# Patient Record
Sex: Female | Born: 1940 | Race: White | Hispanic: No | Marital: Married | Smoking: Never smoker
Health system: Southern US, Community
[De-identification: ages and names within clinical notes are randomized; demographics above are authoritative.]

## PROBLEM LIST (undated history)

## (undated) DIAGNOSIS — F329 Major depressive disorder, single episode, unspecified: Secondary | ICD-10-CM

## (undated) DIAGNOSIS — K219 Gastro-esophageal reflux disease without esophagitis: Secondary | ICD-10-CM

## (undated) DIAGNOSIS — E785 Hyperlipidemia, unspecified: Secondary | ICD-10-CM

## (undated) DIAGNOSIS — R7302 Impaired glucose tolerance (oral): Secondary | ICD-10-CM

## (undated) DIAGNOSIS — M7072 Other bursitis of hip, left hip: Secondary | ICD-10-CM

## (undated) DIAGNOSIS — F32A Depression, unspecified: Secondary | ICD-10-CM

## (undated) DIAGNOSIS — I10 Essential (primary) hypertension: Secondary | ICD-10-CM

## (undated) HISTORY — DX: Impaired glucose tolerance (oral): R73.02

## (undated) HISTORY — PX: CHOLECYSTECTOMY: SHX55

## (undated) HISTORY — DX: Essential (primary) hypertension: I10

## (undated) HISTORY — DX: Gastro-esophageal reflux disease without esophagitis: K21.9

## (undated) HISTORY — DX: Depression, unspecified: F32.A

## (undated) HISTORY — DX: Other bursitis of hip, left hip: M70.72

## (undated) HISTORY — PX: TONSILLECTOMY: SUR1361

## (undated) HISTORY — DX: Hyperlipidemia, unspecified: E78.5

---
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
## 2019-10-22 ENCOUNTER — Telehealth: Payer: Self-pay | Admitting: *Deleted

## 2019-10-22 NOTE — Telephone Encounter (Signed)
Spoke with [name] and rescheduled his appointment for 12/02/19 with Dr. Ardyth Harps so that his wife, Sonya Armstrong could get established first on 10/27/19.  Copied from CRM [id number]. Topic: General - Other >> Oct 22, 2019  1:15 PM Angela Nevin wrote: Patient's husband would like to know if patient can come with him and be seen right after his upcoming appointment on 1/13. Patient's husband is Sonya Armstrong.

## 2019-10-27 ENCOUNTER — Encounter: Payer: Self-pay | Admitting: Internal Medicine

## 2019-10-27 ENCOUNTER — Ambulatory Visit (INDEPENDENT_AMBULATORY_CARE_PROVIDER_SITE_OTHER): Payer: Medicare HMO | Admitting: Internal Medicine

## 2019-10-27 ENCOUNTER — Other Ambulatory Visit: Payer: Self-pay

## 2019-10-27 VITALS — BP 110/70 | HR 58 | Temp 97.7°F | Ht 61.0 in | Wt 127.7 lb

## 2019-10-27 DIAGNOSIS — Z23 Encounter for immunization: Secondary | ICD-10-CM

## 2019-10-27 DIAGNOSIS — R7302 Impaired glucose tolerance (oral): Secondary | ICD-10-CM

## 2019-10-27 DIAGNOSIS — F32A Depression, unspecified: Secondary | ICD-10-CM | POA: Insufficient documentation

## 2019-10-27 DIAGNOSIS — K219 Gastro-esophageal reflux disease without esophagitis: Secondary | ICD-10-CM | POA: Diagnosis not present

## 2019-10-27 DIAGNOSIS — Z13820 Encounter for screening for osteoporosis: Secondary | ICD-10-CM

## 2019-10-27 DIAGNOSIS — F3342 Major depressive disorder, recurrent, in full remission: Secondary | ICD-10-CM | POA: Diagnosis not present

## 2019-10-27 DIAGNOSIS — I10 Essential (primary) hypertension: Secondary | ICD-10-CM | POA: Diagnosis not present

## 2019-10-27 DIAGNOSIS — M7062 Trochanteric bursitis, left hip: Secondary | ICD-10-CM

## 2019-10-27 DIAGNOSIS — M7072 Other bursitis of hip, left hip: Secondary | ICD-10-CM | POA: Insufficient documentation

## 2019-10-27 DIAGNOSIS — F329 Major depressive disorder, single episode, unspecified: Secondary | ICD-10-CM | POA: Insufficient documentation

## 2019-10-27 DIAGNOSIS — E785 Hyperlipidemia, unspecified: Secondary | ICD-10-CM

## 2019-10-27 NOTE — Patient Instructions (Signed)
-  Nice seeing you today!!  -Pneumonia vaccine today.  -Start taking celexa every other day until you see me next.  -Schedule follow up in 3 months for your physical. Please come in fasting that day.

## 2019-10-27 NOTE — Addendum Note (Signed)
Addended by: Kern Reap B on: 10/27/2019 05:51 PM   Modules accepted: Orders

## 2019-10-27 NOTE — Progress Notes (Signed)
New Patient Office Visit     This visit occurred during the SARS-CoV-2 public health emergency.  Safety protocols were in place, including screening questions prior to the visit, additional usage of staff PPE, and extensive cleaning of exam room while observing appropriate contact time as indicated for disinfecting solutions.    CC/Reason for Visit: Establish care, discuss chronic medical conditions Previous PCP: In [place] Last Visit: About 18 months ago  HPI: Sonya Armstrong is a 79 y.o. female who is coming in today for the above mentioned reasons. Past Medical History is significant for: Hypertension that has been well controlled for years on atenolol, hydrochlorothiazide, lisinopril.  GERD that is well controlled on omeprazole.  Remote history of depression on Celexa 10 mg daily which she would like to wean off well.  She has a history of hyperlipidemia but has never been on medications.  She also has a history of left hip bursitis and feels "100% better" after trochanteric bursa injections.  She has allergies to penicillin, her past surgical history significant for C-section, cholecystectomy and tonsillectomy.  She is not a smoker, drinks alcohol occasionally, she is retired.  Her family history significant for mother with breast cancer diagnosed at age 69 and a father with coronary artery disease, both parents are deceased.  She is recently married and is here with her new husband today.  She is due for Pneumovax and is willing to get it today, she is already scheduled for her first Covid vaccine on January 28.   Past Medical/Surgical History: Past Medical History:  Diagnosis Date  . Bursitis of left hip   . Depression   . GERD (gastroesophageal reflux disease)   . HTN (hypertension)   . Hyperlipidemia   . IGT (impaired glucose tolerance)     Past Surgical History:  Procedure Laterality Date  . CESAREAN SECTION    . CHOLECYSTECTOMY    . TONSILLECTOMY      Social  History:  reports that she has never smoked. She has never used smokeless tobacco. She reports current alcohol use. She reports that she does not use drugs.  Allergies: Allergies  Allergen Reactions  . Penicillins Rash    Family History:  Family History  Problem Relation Age of Onset  . Breast cancer Mother   . CAD Father      Current Outpatient Medications:  .  aspirin EC 81 MG tablet, Take 81 mg by mouth daily., Disp: , Rfl:  .  atenolol (TENORMIN) 100 MG tablet, Take 100 mg by mouth daily., Disp: , Rfl:  .  citalopram (CELEXA) 10 MG tablet, Take 10 mg by mouth daily., Disp: , Rfl:  .  hydrochlorothiazide (HYDRODIURIL) 25 MG tablet, Take 25 mg by mouth daily., Disp: , Rfl:  .  lisinopril (ZESTRIL) 40 MG tablet, Take 40 mg by mouth daily., Disp: , Rfl:  .  omeprazole (PRILOSEC) 20 MG capsule, Take 20 mg by mouth daily., Disp: , Rfl:  .  simvastatin (ZOCOR) 20 MG tablet, Take 20 mg by mouth daily., Disp: , Rfl:  .  Vitamin D, Cholecalciferol, 10 MCG (400 UNIT) TABS, Take by mouth., Disp: , Rfl:   Review of Systems:  Constitutional: Denies fever, chills, diaphoresis, appetite change and fatigue.  HEENT: Denies photophobia, eye pain, redness, hearing loss, ear pain, congestion, sore throat, rhinorrhea, sneezing, mouth sores, trouble swallowing, neck pain, neck stiffness and tinnitus.   Respiratory: Denies SOB, DOE, cough, chest tightness,  and wheezing.   Cardiovascular: Denies chest pain,  palpitations and leg swelling.  Gastrointestinal: Denies nausea, vomiting, abdominal pain, diarrhea, constipation, blood in stool and abdominal distention.  Genitourinary: Denies dysuria, urgency, frequency, hematuria, flank pain and difficulty urinating.  Endocrine: Denies: hot or cold intolerance, sweats, changes in hair or nails, polyuria, polydipsia. Musculoskeletal: Denies myalgias, back pain, joint swelling, arthralgias and gait problem.  Skin: Denies pallor, rash and wound.  Neurological:  Denies dizziness, seizures, syncope, weakness, light-headedness, numbness and headaches.  Hematological: Denies adenopathy. Easy bruising, personal or family bleeding history  Psychiatric/Behavioral: Denies suicidal ideation, mood changes, confusion, nervousness, sleep disturbance and agitation    Physical Exam: Vitals:   10/27/19 1039  BP: 110/70  Pulse: (!) 58  Temp: 97.7 F (36.5 C)  TempSrc: Temporal  SpO2: 97%  Weight: 127 lb 11.2 oz (57.9 kg)  Height: 5\' 1"  (1.549 m)   Body mass index is 24.13 kg/m.  Constitutional: NAD, calm, comfortable Eyes: PERRL, lids and conjunctivae normal ENMT: Mucous membranes are moist.  Respiratory: clear to auscultation bilaterally, no wheezing, no crackles. Normal respiratory effort. No accessory muscle use.  Cardiovascular: Regular rate and rhythm, no murmurs / rubs / gallops. No extremity edema.   Skin: no rashes, lesions, ulcers. No induration Neurologic: Grossly intact and nonfocal Psychiatric: Normal judgment and insight. Alert and oriented x 3. Normal mood.    Impression and Plan:  Screening for osteoporosis  - Plan: DG Bone Density  Essential hypertension -Well-controlled on current regimen.  Gastroesophageal reflux disease without esophagitis -Without symptoms on PPI therapy.  Recurrent major depressive disorder, in full remission ([place]) -She would like to wean off Celexa, have advised her to take it every other day until she sees me next.  Hyperlipidemia, unspecified hyperlipidemia type -Check lipids when she returns for physical.  IGT (impaired glucose tolerance) -She mentions her last A1c was 6.0, recheck when she returns for physical  Trochanteric bursitis of left hip -100% better after injection of left bursa, will continue to follow-up with orthopedics as needed.     Patient Instructions  -Nice seeing you today!!  -Pneumonia vaccine today.  -Start taking celexa every other day until you see me  next.  -Schedule follow up in 3 months for your physical. Please come in fasting that day.     Lelon Frohlich, MD [facility] at [facility]

## 2019-11-11 ENCOUNTER — Ambulatory Visit: Payer: Self-pay

## 2019-11-18 ENCOUNTER — Ambulatory Visit: Payer: Medicare HMO | Attending: Internal Medicine

## 2019-11-18 DIAGNOSIS — Z23 Encounter for immunization: Secondary | ICD-10-CM | POA: Insufficient documentation

## 2019-11-18 NOTE — Progress Notes (Signed)
   Covid-19 Vaccination Clinic  Name:  ANNALIESE SAEZ    MRN: [id number] DOB: Jan 21, 1941  11/18/2019  Ms. Kadel was observed post Covid-19 immunization for 15 minutes without incidence. She was provided with Vaccine Information Sheet and instruction to access the V-Safe system.   Ms. Egley was instructed to call 911 with any severe reactions post vaccine: Marland Kitchen Difficulty breathing  . Swelling of your face and throat  . A fast heartbeat  . A bad rash all over your body  . Dizziness and weakness    Immunizations Administered    Name Date Dose VIS Date Route   Pfizer COVID-19 Vaccine 11/18/2019  9:14 AM 0.3 mL 09/24/2019 Intramuscular   Manufacturer: [employer], [employer]   Lot: [id number]   NDC: [address]-4

## 2019-12-13 ENCOUNTER — Ambulatory Visit: Payer: Medicare HMO | Attending: Internal Medicine

## 2019-12-13 DIAGNOSIS — Z23 Encounter for immunization: Secondary | ICD-10-CM | POA: Insufficient documentation

## 2019-12-13 NOTE — Progress Notes (Signed)
   Covid-19 Vaccination Clinic  Name:  JUAN OLTHOFF    MRN: [id number] DOB: 10/03/1941  12/13/2019  Ms. Weberg was observed post Covid-19 immunization for 15 minutes without incidence. She was provided with Vaccine Information Sheet and instruction to access the V-Safe system.   Ms. Forrer was instructed to call 911 with any severe reactions post vaccine: Marland Kitchen Difficulty breathing  . Swelling of your face and throat  . A fast heartbeat  . A bad rash all over your body  . Dizziness and weakness    Immunizations Administered    Name Date Dose VIS Date Route   Pfizer COVID-19 Vaccine 12/13/2019  2:00 PM 0.3 mL 09/24/2019 Intramuscular   Manufacturer: [employer], [employer]   Lot: [id number]   NDC: [address]-2

## 2020-01-24 ENCOUNTER — Other Ambulatory Visit: Payer: Self-pay

## 2020-01-25 ENCOUNTER — Ambulatory Visit (INDEPENDENT_AMBULATORY_CARE_PROVIDER_SITE_OTHER): Payer: Medicare HMO | Admitting: Internal Medicine

## 2020-01-25 ENCOUNTER — Ambulatory Visit (INDEPENDENT_AMBULATORY_CARE_PROVIDER_SITE_OTHER): Payer: Medicare HMO

## 2020-01-25 ENCOUNTER — Encounter: Payer: Self-pay | Admitting: Internal Medicine

## 2020-01-25 ENCOUNTER — Other Ambulatory Visit: Payer: Self-pay | Admitting: Internal Medicine

## 2020-01-25 VITALS — BP 110/78 | HR 80 | Temp 98.1°F | Wt 130.6 lb

## 2020-01-25 DIAGNOSIS — R1084 Generalized abdominal pain: Secondary | ICD-10-CM

## 2020-01-25 DIAGNOSIS — R112 Nausea with vomiting, unspecified: Secondary | ICD-10-CM

## 2020-01-25 LAB — COMPREHENSIVE METABOLIC PANEL
ALT: 6 U/L (ref 0–35)
AST: 11 U/L (ref 0–37)
Albumin: 3.4 g/dL — ABNORMAL LOW (ref 3.5–5.2)
Alkaline Phosphatase: 65 U/L (ref 39–117)
BUN: 15 mg/dL (ref 6–23)
CO2: 26 mEq/L (ref 19–32)
Calcium: 8 mg/dL — ABNORMAL LOW (ref 8.4–10.5)
Chloride: 98 mEq/L (ref 96–112)
Creatinine, Ser: 0.93 mg/dL (ref 0.40–1.20)
GFR: 58.45 mL/min — ABNORMAL LOW (ref >60.00)
Glucose, Bld: 92 mg/dL (ref 70–99)
Potassium: 4 mEq/L (ref 3.5–5.1)
Sodium: 132 mEq/L — ABNORMAL LOW (ref 135–145)
Total Bilirubin: 1 mg/dL (ref 0.2–1.2)
Total Protein: 5.8 g/dL — ABNORMAL LOW (ref 6.0–8.3)

## 2020-01-25 LAB — CBC WITH DIFFERENTIAL/PLATELET
Basophils Absolute: 0 10*3/uL (ref 0.0–0.1)
Basophils Relative: 0.8 % (ref 0.0–3.0)
Eosinophils Absolute: 0.1 10*3/uL (ref 0.0–0.7)
Eosinophils Relative: 0.9 % (ref 0.0–5.0)
HCT: 36 % (ref 36.0–46.0)
Hemoglobin: 12.3 g/dL (ref 12.0–15.0)
Lymphocytes Relative: 18.8 % (ref 12.0–46.0)
Lymphs Abs: 1.1 10*3/uL (ref 0.7–4.0)
MCHC: 34.1 g/dL (ref 30.0–36.0)
MCV: 89.5 fl (ref 78.0–100.0)
Monocytes Absolute: 0.6 10*3/uL (ref 0.1–1.0)
Monocytes Relative: 10.4 % (ref 3.0–12.0)
Neutro Abs: 4 10*3/uL (ref 1.4–7.7)
Neutrophils Relative %: 69.1 % (ref 43.0–77.0)
Platelets: 340 10*3/uL (ref 150.0–400.0)
RBC: 4.02 Mil/uL (ref 3.87–5.11)
RDW: 12.7 % (ref 11.5–15.5)
WBC: 5.8 10*3/uL (ref 4.0–10.5)

## 2020-01-25 MED ORDER — ONDANSETRON 4 MG PO TBDP: 4.0000 mg | ORAL_TABLET | Freq: Three times a day (TID) | ORAL | 0 refills | Status: AC | PRN

## 2020-01-25 NOTE — Patient Instructions (Signed)
-  Nice seeing you today!!  -Lab work today; will notify you once results are available.  -Abdominal xray today. Will notify you with results.

## 2020-01-25 NOTE — Progress Notes (Signed)
Acute Office Visit     This visit occurred during the SARS-CoV-2 public health emergency.  Safety protocols were in place, including screening questions prior to the visit, additional usage of staff PPE, and extensive cleaning of exam room while observing appropriate contact time as indicated for disinfecting solutions.    CC/Reason for Visit: Abdominal pain, nausea, vomiting  HPI: Sonya Armstrong is a 79 y.o. female who is coming in today for the above mentioned reasons.  She has been having the above described symptoms for going on 2 weeks now.  She is here today with her husband who is very concerned.  She has been having diffuse abdominal pain, which she describes as loose stools about 3 episodes a day, not watery "with chunks inside".  She denies fever.  She has had 2 abdominal surgeries: A cholecystectomy in 2012 and a C-section many years ago.  Her abdomen has been very distended, she denies flatulence, constipation.  She has hardly been able to eat in 2 weeks due to her pain.   Past Medical/Surgical History: Past Medical History:  Diagnosis Date  . Bursitis of left hip   . Depression   . GERD (gastroesophageal reflux disease)   . HTN (hypertension)   . Hyperlipidemia   . IGT (impaired glucose tolerance)     Past Surgical History:  Procedure Laterality Date  . CESAREAN SECTION    . CHOLECYSTECTOMY    . TONSILLECTOMY      Social History:  reports that she has never smoked. She has never used smokeless tobacco. She reports current alcohol use. She reports that she does not use drugs.  Allergies: Allergies  Allergen Reactions  . Penicillins Rash    Family History:  Family History  Problem Relation Age of Onset  . Breast cancer Mother   . CAD Father      Current Outpatient Medications:  .  aspirin EC 81 MG tablet, Take 81 mg by mouth daily., Disp: , Rfl:  .  atenolol (TENORMIN) 100 MG tablet, Take 100 mg by mouth daily., Disp: , Rfl:  .  citalopram (CELEXA)  10 MG tablet, Take 10 mg by mouth daily., Disp: , Rfl:  .  hydrochlorothiazide (HYDRODIURIL) 25 MG tablet, Take 25 mg by mouth daily., Disp: , Rfl:  .  lisinopril (ZESTRIL) 40 MG tablet, Take 40 mg by mouth daily., Disp: , Rfl:  .  omeprazole (PRILOSEC) 20 MG capsule, Take 20 mg by mouth daily., Disp: , Rfl:  .  simvastatin (ZOCOR) 20 MG tablet, Take 20 mg by mouth daily., Disp: , Rfl:  .  Vitamin D, Cholecalciferol, 10 MCG (400 UNIT) TABS, Take by mouth., Disp: , Rfl:  .  ondansetron (ZOFRAN ODT) 4 MG disintegrating tablet, Take 1 tablet (4 mg total) by mouth every 8 (eight) hours as needed for nausea or vomiting., Disp: 20 tablet, Rfl: 0  Review of Systems:  Constitutional: Denies fever, chills, diaphoresis, appetite change and fatigue.  HEENT: Denies photophobia, eye pain, redness, hearing loss, ear pain, congestion, sore throat, rhinorrhea, sneezing, mouth sores, trouble swallowing, neck pain, neck stiffness and tinnitus.   Respiratory: Denies SOB, DOE, cough, chest tightness,  and wheezing.   Cardiovascular: Denies chest pain, palpitations and leg swelling.  Gastrointestinal: Positive for nausea, vomiting, abdominal pain, diarrhea and abdominal distention.  Genitourinary: Denies dysuria, urgency, frequency, hematuria, flank pain and difficulty urinating.  Endocrine: Denies: hot or cold intolerance, sweats, changes in hair or nails, polyuria, polydipsia. Musculoskeletal: Denies myalgias, back  pain, joint swelling, arthralgias and gait problem.  Skin: Denies pallor, rash and wound.  Neurological: Denies dizziness, seizures, syncope, weakness, light-headedness, numbness and headaches.  Hematological: Denies adenopathy. Easy bruising, personal or family bleeding history  Psychiatric/Behavioral: Denies suicidal ideation, mood changes, confusion, nervousness, sleep disturbance and agitation    Physical Exam: Vitals:   01/25/20 1124  BP: 110/78  Pulse: 80  Temp: 98.1 F (36.7 C)    TempSrc: Temporal  SpO2: 96%  Weight: 130 lb 9.6 oz (59.2 kg)    Body mass index is 24.68 kg/m.   Constitutional: NAD, calm, comfortable Eyes: PERRL, lids and conjunctivae normal ENMT: Mucous membranes are moist.  Respiratory: clear to auscultation bilaterally, no wheezing, no crackles. Normal respiratory effort. No accessory muscle use.  Cardiovascular: Regular rate and rhythm, no murmurs / rubs / gallops. No extremity edema.  Abdomen: Distended, hypoactive bowel sounds, no masses palpated, diffusely tender to palpation but no rebound, guarding.  Negative Murphy sign. Neurologic: Grossly intact and nonfocal. Psychiatric: Normal judgment and insight. Alert and oriented x 3. Normal mood.    Impression and Plan:  Generalized abdominal pain  Intractable vomiting with nausea, unspecified vomiting type   -Etiology unclear. -Unlikely to be small bowel obstruction despite distention given ongoing bowel movements. -Unlikely viral gastroenteritis or food poisoning given duration of acute illness. -She has no focal abdominal pain, no rebound, no tenderness, no discrete pain to palpation on exam. -Sent for abdominal film, CBC, comprehensive metabolic panel. -Prescribe Zofran for nausea. -May need to proceed with CT scan pending results of abdominal film.    Patient Instructions  -Nice seeing you today!!  -Lab work today; will notify you once results are available.  -Abdominal xray today. Will notify you with results.     Lelon Frohlich, MD [facility] at [facility]

## 2020-01-26 ENCOUNTER — Other Ambulatory Visit: Payer: Self-pay | Admitting: Internal Medicine

## 2020-01-26 DIAGNOSIS — R140 Abdominal distension (gaseous): Secondary | ICD-10-CM

## 2020-02-12 DEATH — deceased

## 2021-02-11 IMAGING — DX DG ABDOMEN 1V
1 series · 1 of 1 positions shown · non-contrast
Comparison: None

CLINICAL DATA: Abdominal pain and distension

EXAM:
ABDOMEN - 1 VIEW

[abdomen supine ap]
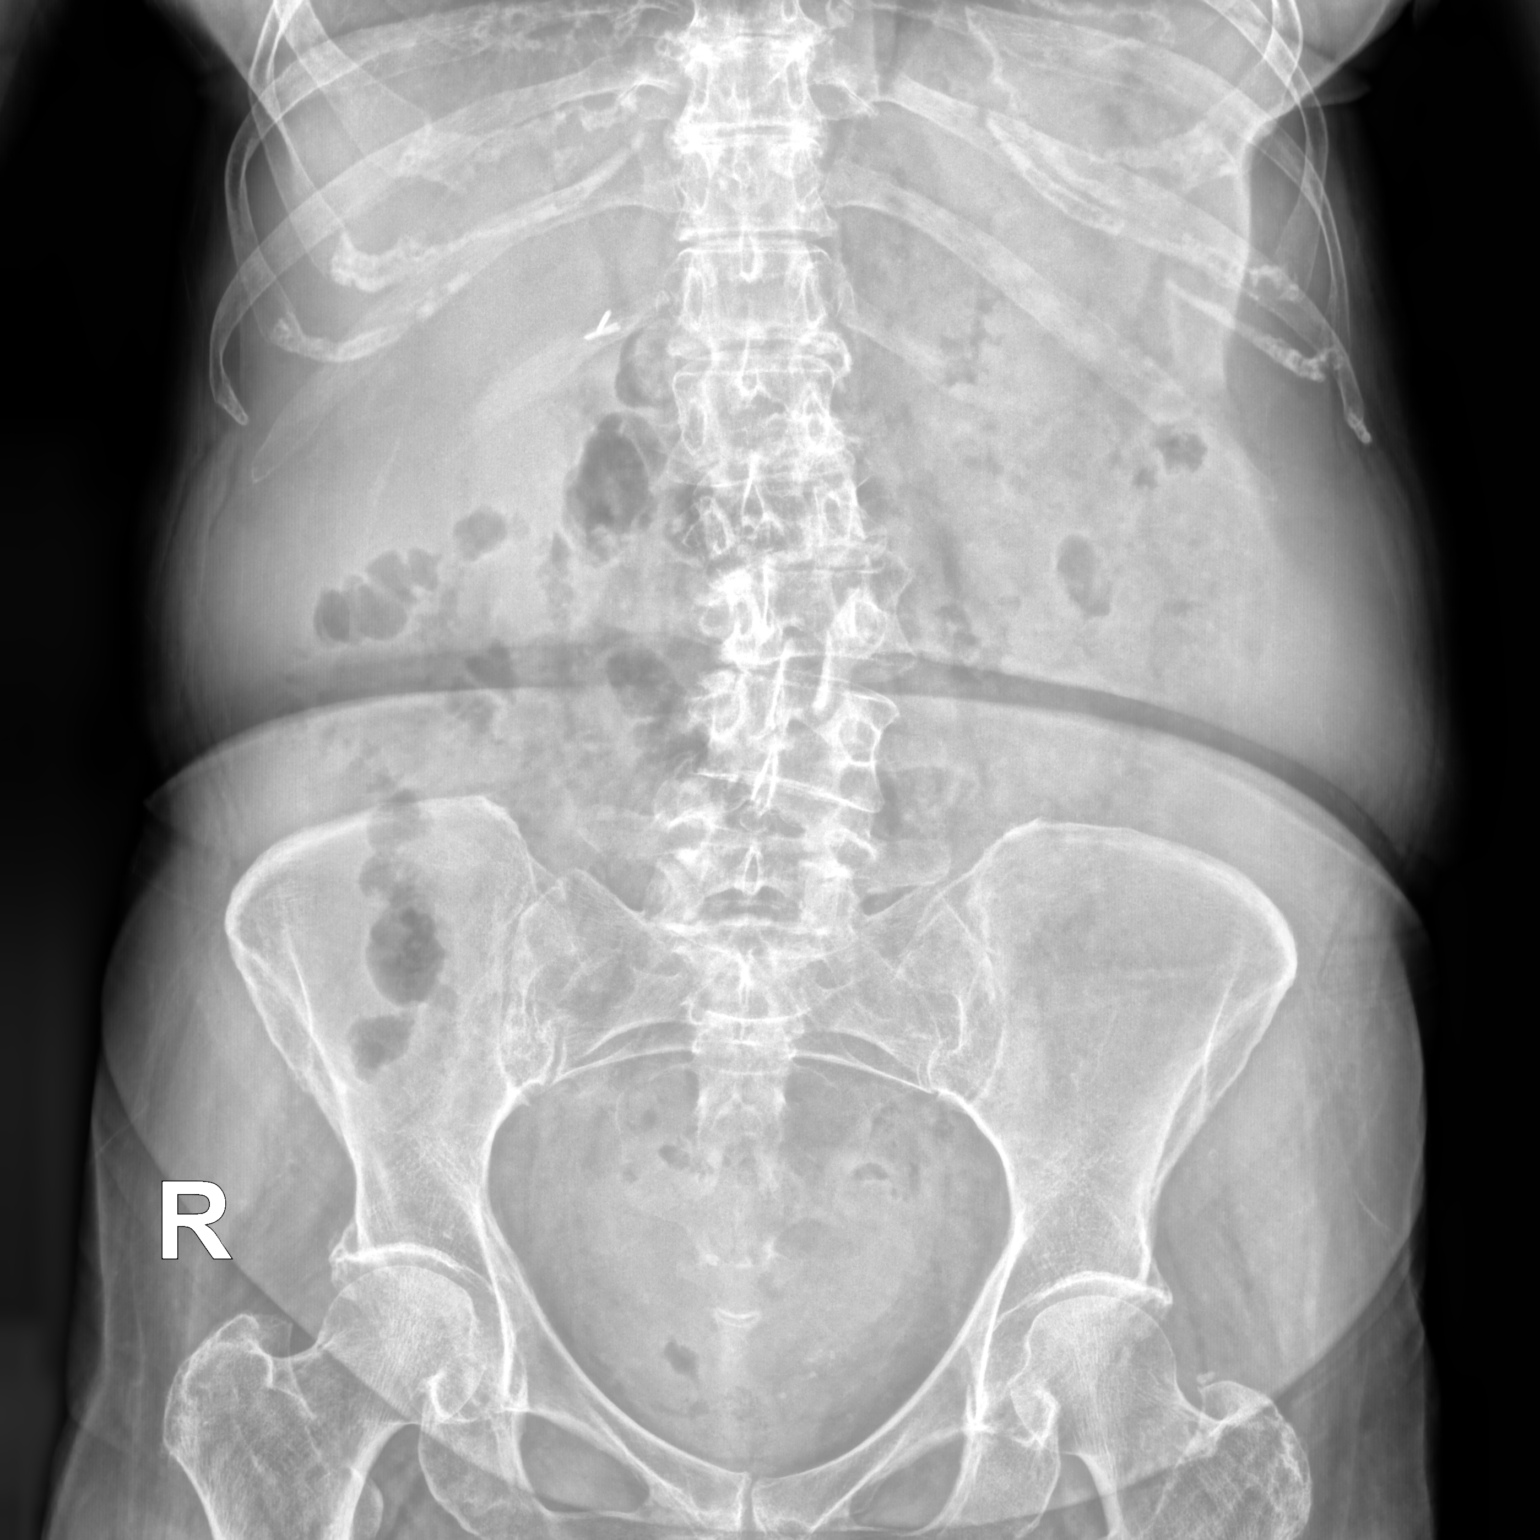

[1 of 1 positions shown; findings below may reference images not displayed]

FINDINGS: Grid artifact is noted across the image with alternating vertical
bands of lucency. Images remain of diagnostic utility.

No high-grade obstructive bowel gas pattern is seen. No suspicious
calcifications over the urinary tract. Prior cholecystectomy clips
in the right upper quadrant. Degenerative changes present in the
spine with mild levocurvature which appears centered at the L3
level. Additional degenerative changes in the hips and SI joints.
IMPRESSION: No high-grade obstructive bowel gas pattern or suspicious
calcification.

## 2021-10-03 ENCOUNTER — Other Ambulatory Visit (HOSPITAL_COMMUNITY): Payer: Self-pay
